# Patient Record
Sex: Male | Born: 1975 | Race: Black or African American | Hispanic: No | Marital: Married | Smoking: Light tobacco smoker
Health system: Southern US, Community
[De-identification: ages and names within clinical notes are randomized; demographics above are authoritative.]

## PROBLEM LIST (undated history)

## (undated) DIAGNOSIS — T7840XA Allergy, unspecified, initial encounter: Secondary | ICD-10-CM

## (undated) HISTORY — PX: WISDOM TOOTH EXTRACTION: SHX21

## (undated) HISTORY — PX: COLONOSCOPY: SHX174

## (undated) HISTORY — DX: Allergy, unspecified, initial encounter: T78.40XA

---

## 2000-05-24 ENCOUNTER — Emergency Department (HOSPITAL_COMMUNITY): Admission: EM | Admit: 2000-05-24 | Discharge: 2000-05-24 | Payer: Self-pay | Admitting: Emergency Medicine

## 2010-05-21 ENCOUNTER — Encounter (INDEPENDENT_AMBULATORY_CARE_PROVIDER_SITE_OTHER): Payer: Self-pay | Admitting: *Deleted

## 2010-06-16 ENCOUNTER — Ambulatory Visit
Admission: RE | Admit: 2010-06-16 | Discharge: 2010-06-16 | Payer: Self-pay | Source: Home / Self Care | Attending: Gastroenterology | Admitting: Gastroenterology

## 2010-06-25 ENCOUNTER — Encounter (INDEPENDENT_AMBULATORY_CARE_PROVIDER_SITE_OTHER): Payer: Self-pay | Admitting: *Deleted

## 2010-06-26 NOTE — Miscellaneous (Signed)
Summary: [facility] PV- not due for colon  Clinical Lists Changes  Pt thought his mother had colon CA but she did not.  Father had liver CA but no colon CA.  Pt is 35 and is not having any GI issues.  Talked w/ patient and told him that he would not need screening colonoscopy until he was 49.  Colonoscopy for 2/6 cancelled

## 2010-06-26 NOTE — Letter (Signed)
Summary: Pre Visit Letter Revised  [facility]  [address] [place], [place] [address]   Phone: [phone]  Fax: [phone]        05/21/2010 MRN: [id number] [facility] [address] [place], [place]  [address]             Procedure Date:  06-30-10   Welcome to the Gastroenterology Division at [facility].    You are scheduled to see a nurse for your pre-procedure visit on 06-16-10 at 3:30P.M. on the 3rd floor at [facility], [address].  We ask that you try to arrive at our office 15 minutes prior to your appointment time to allow for check-in.  Please take a minute to review the attached form.  If you answer "Yes" to one or more of the questions on the first Dome[place], we ask that you call the person listed at your earliest opportunity.  If you answer "No" to all of the questions, please complete the rest of the form and bring it to your appointment.    Your nurse visit will consist of discussing your medical and surgical history, your immediate family medical history, and your medications.   If you are unable to list all of your medications on the form, please bring the medication bottles to your appointment and we will list them.  We will need to be aware of both prescribed and over the counter drugs.  We will need to know exact dosage information as well.    Please be prepared to read and sign documents such as consent forms, a financial agreement, and acknowledgement forms.  If necessary, and with your consent, a friend or relative is welcome to sit-in on the nurse visit with you.  Please bring your insurance card so that we may make a copy of it.  If your insurance requires a referral to see a specialist, please bring your referral form from your primary care physician.  No co-pay is required for this nurse visit.     If you cannot keep your appointment, please call [phone] to cancel or reschedule prior to your appointment date.  This allows [place]  the opportunity to schedule an appointment for another patient in need of care.    Thank you for choosing [facility] for your medical needs.  We appreciate the opportunity to care for you.  Please visit [place] at our website  to learn more about our practice.  Sincerely, The Gastroenterology Division

## 2010-06-30 ENCOUNTER — Other Ambulatory Visit: Payer: Self-pay | Admitting: Gastroenterology

## 2010-07-02 NOTE — Letter (Signed)
Summary: Pre Visit Letter Revised  [facility]  [address] [place], [place] [address]   Phone: [phone]  Fax: [phone]        06/25/2010 MRN: [id number] [facility] [place] [address] [place], [place]  [address]             Procedure Date: July 18, 2010   dir col-Dr Annamarie Major to the Gastroenterology Division at [facility].    You are scheduled to see a nurse for your pre-procedure visit on July 04, 2010 at 2:30pm on the 3rd floor at [facility], [address].  We ask that you try to arrive at our office 15 minutes prior to your appointment time to allow for check-in.  Please take a minute to review the attached form.  If you answer "Yes" to one or more of the questions on the first [place], we ask that you call the person listed at your earliest opportunity.  If you answer "No" to all of the questions, please complete the rest of the form and bring it to your appointment.    Your nurse visit will consist of discussing your medical and surgical history, your immediate family medical history, and your medications.   If you are unable to list all of your medications on the form, please bring the medication bottles to your appointment and we will list them.  We will need to be aware of both prescribed and over the counter drugs.  We will need to know exact dosage information as well.    Please be prepared to read and sign documents such as consent forms, a financial agreement, and acknowledgement forms.  If necessary, and with your consent, a friend or relative is welcome to sit-in on the nurse visit with you.  Please bring your insurance card so that we may make a copy of it.  If your insurance requires a referral to see a specialist, please bring your referral form from your primary care physician.  No co-pay is required for this nurse visit.     If you cannot keep your appointment, please call [phone] to cancel or reschedule prior to  your appointment date.  This allows [place] the opportunity to schedule an appointment for another patient in need of care.    Thank you for choosing [facility] for your medical needs.  We appreciate the opportunity to care for you.  Please visit [place] at our website  to learn more about our practice.  Sincerely, The Gastroenterology Division

## 2010-07-03 ENCOUNTER — Encounter (INDEPENDENT_AMBULATORY_CARE_PROVIDER_SITE_OTHER): Payer: Self-pay | Admitting: *Deleted

## 2010-07-04 ENCOUNTER — Telehealth: Payer: Self-pay | Admitting: Gastroenterology

## 2010-07-04 ENCOUNTER — Encounter: Payer: Self-pay | Admitting: Gastroenterology

## 2010-07-10 NOTE — Progress Notes (Signed)
Summary: ? time for colonoscopy  Phone Note Call from Patient   Summary of Call: Patient is self referred (no primary MD) in today for previsit for colonoscopy due to family history. He came in on 06-16-10 for PV but his procedure was cancelled due to the fact he told nurse that he thought his mother had colon ca but when he called his sister during the PV she said no and that his father had liver cancer. Patient back today states his father did have colon cancer in his 55's. Patient denies any GI concerns or problems. Patient is 35 yo african [place] male. Should patient have colonoscopy at this time? please advise, thanks Initial call taken by: Sherren Kerns RN,  July 04, 2010 2:51 PM  Follow-up for Phone Call        Typically we recommend to start CRC screening about 10 years before the family member had colon cancer, and no later than age 3, which ever is sooner. Some recommend screening african americans earlier than others. Although this is a little before than age 35 it is reasonable for him to have a higher risk screening colonoscopy now. Follow-up by: Meryl Dare MD Clementeen Graham,  July 04, 2010 3:21 PM  Additional Follow-up for Phone Call Additional follow up Details #1::        Patient will have colonoscopy as scheduled.  Additional Follow-up by: Sherren Kerns RN,  July 04, 2010 5:14 PM

## 2010-07-10 NOTE — Miscellaneous (Signed)
Summary: --previsit done  Clinical Lists Changes  Medications: Added new medication of MOVIPREP 100 GM  SOLR (PEG-KCL-NACL-NASULF-NA ASC-C) As per prep instructions. - Signed Rx of MOVIPREP 100 GM  SOLR (PEG-KCL-NACL-NASULF-NA ASC-C) As per prep instructions.;  #1 x 0;  Signed;  Entered by: Sherren Kerns RN;  Authorized by: Meryl Dare MD [facility];  Method used: Electronically to [facility]  [phone]*, [address] [place], [place], [place]  [address], Ph: [phone] or [phone], Fax: [phone] Allergies: Added new allergy or adverse reaction of * DIMEATAPP Observations: Added new observation of ALLERGY REV: Done (07/04/2010 14:19) Added new observation of NKA: F (07/04/2010 14:19)    Prescriptions: MOVIPREP 100 GM  SOLR (PEG-KCL-NACL-NASULF-NA ASC-C) As per prep instructions.  #1 x 0   Entered by:   Sherren Kerns RN   Authorized by:   Meryl Dare MD [facility]   Signed by:   Sherren Kerns RN on 07/04/2010   Method used:   Electronically to        [facility]  [phone]* (retail)       [address]       [place], [place]  [address]       Ph: [phone] or [phone]       Fax: [phone]   RxID:   [id number]

## 2010-07-10 NOTE — Letter (Signed)
Summary: [facility] Instructions  [facility]  [address] [place], [place] [address]   Phone: [phone]  Fax: [phone]       Randall Ramirez    1976-02-07    MRN: [id number]        Procedure Day /Date: Friday 07/18/10     Arrival Time: 7:30 am     Procedure Time: 8:30 am     Location of Procedure:                    _X_  [facility] (4th Floor)  PREPARATION FOR COLONOSCOPY WITH MOVIPREP   Starting 5 days prior to your procedure 06/12/10  do not eat nuts, seeds, popcorn, corn, beans, peas,  salads, or any raw vegetables.  Do not take any fiber supplements (e.g. Metamucil, Citrucel, and Benefiber).  THE DAY BEFORE YOUR PROCEDURE         DATE: 07/17/10    DAY: Thursday    1.  Drink clear liquids the entire day-NO SOLID FOOD  2.  Do not drink anything colored red or purple.  Avoid juices with pulp.  No orange juice.  3.  Drink at least 64 oz. (8 glasses) of fluid/clear liquids during the day to prevent dehydration and help the prep work efficiently.  CLEAR LIQUIDS INCLUDE: Water Jello Ice Popsicles Tea (sugar ok, no milk/cream) Powdered fruit flavored drinks Coffee (sugar ok, no milk/cream) Gatorade Juice: apple, white grape, white cranberry  Lemonade Clear bullion, consomm, broth Carbonated beverages (any kind) Strained chicken noodle soup Hard Candy                             4.  In the morning, mix first dose of MoviPrep solution:    Empty 1 Pouch A and 1 Pouch B into the disposable container    Add lukewarm drinking water to the top line of the container. Mix to dissolve    Refrigerate (mixed solution should be used within 24 hrs)  5.  Begin drinking the prep at 5:00 p.m. The MoviPrep container is divided by 4 marks.   Every 15 minutes drink the solution down to the next mark (approximately 8 oz) until the full liter is complete.   6.  Follow completed prep with 16 oz of clear liquid of your choice (Nothing red or purple).   Continue to drink clear liquids until bedtime.  7.  Before going to bed, mix second dose of MoviPrep solution:    Empty 1 Pouch A and 1 Pouch B into the disposable container    Add lukewarm drinking water to the top line of the container. Mix to dissolve    Refrigerate  THE DAY OF YOUR PROCEDURE      DATE: 07/18/10   DAY: Friday   Beginning at 3:30 a.m. (5 hours before procedure):         1. Every 15 minutes, drink the solution down to the next mark (approx 8 oz) until the full liter is complete.  2. Follow completed prep with 16 oz. of clear liquid of your choice.    3. You may drink clear liquids until 6:30 am  (2 HOURS BEFORE PROCEDURE).   MEDICATION INSTRUCTIONS  Unless otherwise instructed, you should take regular prescription medications with a small sip of water   as early as possible the morning of your procedure.         OTHER INSTRUCTIONS  You  will need a responsible adult at least 36 years of age to accompany you and drive you home.   This person must remain in the waiting room during your procedure.  Wear loose fitting clothing that is easily removed.  Leave jewelry and other valuables at home.  However, you may wish to bring a book to read or  an iPod/MP3 player to listen to music as you wait for your procedure to start.  Remove all body piercing jewelry and leave at home.  Total time from sign-in until discharge is approximately 2-3 hours.  You should go home directly after your procedure and rest.  You can resume normal activities the  day after your procedure.  The day of your procedure you should not:   Drive   Make legal decisions   Operate machinery   Drink alcohol   Return to work  You will receive specific instructions about eating, activities and medications before you leave.    The above instructions have been reviewed and explained to me by   Sherren Kerns RN  July 04, 2010 2:35 PM     I fully understand and can verbalize  these instructions _____________________________ Date _________

## 2010-07-18 ENCOUNTER — Encounter: Payer: Self-pay | Admitting: Gastroenterology

## 2010-07-18 ENCOUNTER — Other Ambulatory Visit (AMBULATORY_SURGERY_CENTER): Payer: Managed Care, Other (non HMO) | Admitting: Gastroenterology

## 2010-07-18 DIAGNOSIS — Z1211 Encounter for screening for malignant neoplasm of colon: Secondary | ICD-10-CM

## 2010-07-18 DIAGNOSIS — Z800 Family history of malignant neoplasm of digestive organs: Secondary | ICD-10-CM

## 2010-07-22 NOTE — Procedures (Addendum)
Summary: Colonoscopy  Patient: Randall Ramirez Note: All result statuses are Final unless otherwise noted.  Tests: (1) Colonoscopy (COL)   COL Colonoscopy           DONE (C)     [facility]     [address]     [place], [place]  [address]          OPERATIVE PROCEDURE REPORT          PATIENT:  Randall Ramirez, Randall Ramirez  MR#:  [id number]     BIRTHDATE:  1976/03/19, 34 yrs. old  GENDER:  male     ENDOSCOPIST:  Judie Petit T. Russella Dar, MD, [facility]          PROCEDURE DATE:  07/18/2010     PROCEDURE:  Colonoscopy [address]     ASA CLASS:  Class I     INDICATIONS:  Elevated Risk Screening, family history of colon     cancer: father in his 62s.     MEDICATIONS:   Fentanyl 50 mcg IV, Versed 6 mg IV     DESCRIPTION OF PROCEDURE:   After the risks benefits and     alternatives of the procedure were thoroughly explained, informed     consent was obtained.  Digital rectal exam was performed and     revealed no abnormalities.   The LB [id number]-[id number] endoscope     was introduced through the anus and advanced to the cecum, which     was identified by both the appendix and ileocecal valve, without     limitations.  The quality of the prep was excellent, using     MoviPrep.  The instrument was then slowly withdrawn as the colon     was fully examined.          <<PROCEDUREIMAGES>>          FINDINGS:  A normal appearing cecum, ileocecal valve, and     appendiceal orifice were identified. The ascending, hepatic     flexure, transverse, splenic flexure, descending, sigmoid colon,     and rectum appeared unremarkable.  Retroflexed views in the rectum     revealed no abnormalities.  The scope was then withdrawn from the     patient and the procedure terminated.          COMPLICATIONS:  None          ENDOSCOPIC IMPRESSION:     1) Normal colon          RECOMMENDATIONS:     1) Given your family history of colon cancer, you should have a     repeat colonoscopy in 5 years          Randall Ramirez T. Russella Dar,  MD, [facility]          n.     REVISED:  07/30/2010 08:45 AM     eSIGNED:   Venita Lick. Ioma Chismar at 07/30/2010 08:45 AM          Wiens, Iantha Fallen, [id number]  Note: An exclamation mark (!) indicates a result that was not dispersed into the flowsheet. Document Creation Date: 07/30/2010 8:45 AM _______________________________________________________________________  (1) Order result status: Final Collection or observation date-time: 07/18/2010 08:54 Requested date-time:  Receipt date-time:  Reported date-time:  Referring Physician:   Ordering Physician: Claudette Head [phone]) Specimen Source:  Source: Launa Grill Order Number: [id number] Lab site:

## 2014-08-08 ENCOUNTER — Encounter: Payer: Self-pay | Admitting: Podiatrist

## 2014-08-08 ENCOUNTER — Ambulatory Visit (INDEPENDENT_AMBULATORY_CARE_PROVIDER_SITE_OTHER): Payer: Managed Care, Other (non HMO)

## 2014-08-08 ENCOUNTER — Ambulatory Visit (INDEPENDENT_AMBULATORY_CARE_PROVIDER_SITE_OTHER): Payer: Managed Care, Other (non HMO) | Admitting: Podiatrist

## 2014-08-08 VITALS — BP 157/90 | HR 100 | Resp 12

## 2014-08-08 DIAGNOSIS — M7661 Achilles tendinitis, right leg: Secondary | ICD-10-CM | POA: Diagnosis not present

## 2014-08-08 DIAGNOSIS — R52 Pain, unspecified: Secondary | ICD-10-CM

## 2014-08-08 DIAGNOSIS — M7662 Achilles tendinitis, left leg: Secondary | ICD-10-CM

## 2014-08-08 MED ORDER — METHYLPREDNISOLONE (PAK) 4 MG PO TABS: ORAL_TABLET | ORAL | Status: DC

## 2014-08-08 NOTE — Progress Notes (Signed)
   Subjective:    Patient ID: Bernie CoveyKenneth E Kauffmann, male    DOB: 1976/02/22, 39 y.o.   MRN: [id number]  HPI  PT STATED BACK OF THE HEEL ARE SORE FOR 8 MONTHS. HEEL ARE GETTING WORSE ESPECIALLY FIRST STEP IN THE MORNING. TRIED SOAK WITH WARM WATER.  Review of Systems  Allergic/Immunologic: Positive for environmental allergies.  All other systems reviewed and are negative.      Objective:   Physical Exam  GENERAL APPEARANCE: Alert, conversant. Appropriately groomed. No acute distress.  VASCULAR: Pedal pulses palpable and strong bilateral.  Capillary refill time is immediate to all digits,  Proximal to distal cooling it warm to warm.  Digital hair growth is present bilateral  NEUROLOGIC: sensation is intact epicritically and protectively to 5.07 monofilament at 5/5 sites bilateral.  Light touch is intact bilateral, vibratory sensation intact bilateral, achilles tendon reflex is intact bilateral.  Negative Tinel sign is elicited MUSCULOSKELETAL:  Pain on palpation posterior aspect of bilateral heels at insertion of achilles tendon on posterior calcaneus is noted.  Moderate inflammation medially and laterally at the insertion points is noted.  No pain along the platnar fascia insertion bilateral.  Rectus foot type is seen. Achilles tendon is palpated and is intact.  DERMATOLOGIC: skin color, texture, and turger are within normal limits.  No preulcerative lesions are seen, no interdigital maceration noted.  No open lesions present.  Digital nails are asymptomatic.     Assessment & Plan:  Achilles tendonitis bilateral  Plan:  Discussed treatment options and alternatives.  Recommended topical antiinflammatory from [facility] as well as a medro dose pack and stretching exercises.  Will recheck in 4 weeks and will consider boot or pt based on improvement.

## 2014-08-08 NOTE — Patient Instructions (Signed)
You have been prescribed a topical pain medications through a specialty compounding pharmacy called [facility] out of [place], [place]. A representative from the pharmacy will call to ensure you would like to receive the medication. If you do not hear from them within two business days please call to confirm they have received the prescription. Their telephone number is [phone].    If you have any other questions or concerns regarding the medication please call our office.     Achilles Tendinitis  with Rehab Achilles tendinitis is a disorder of the Achilles tendon. The Achilles tendon connects the large calf muscles (Gastrocnemius and Soleus) to the heel bone (calcaneus). This tendon is sometimes called the heel cord. It is important for pushing-off and standing on your toes and is important for walking, running, or jumping. Tendinitis is often caused by overuse and repetitive microtrauma. SYMPTOMS  Pain, tenderness, swelling, warmth, and redness may occur over the Achilles tendon even at rest.  Pain with pushing off, or flexing or extending the ankle.  Pain that is worsened after or during activity. CAUSES   Overuse sometimes seen with rapid increase in exercise programs or in sports requiring running and jumping.  Poor physical conditioning (strength and flexibility or endurance).  Running sports, especially training running down hills.  Inadequate warm-up before practice or play or failure to stretch before participation.  Injury to the tendon. PREVENTION   Warm up and stretch before practice or competition.  Allow time for adequate rest and recovery between practices and competition.  Keep up conditioning.  Keep up ankle and leg flexibility.  Improve or keep muscle strength and endurance.  Improve cardiovascular fitness.  Use proper technique.  Use proper equipment (shoes, skates).  To help prevent recurrence, taping, protective strapping, or an  adhesive bandage may be recommended for several weeks after healing is complete. PROGNOSIS   Recovery may take weeks to several months to heal.  Longer recovery is expected if symptoms have been prolonged.  Recovery is usually quicker if the inflammation is due to a direct blow as compared with overuse or sudden strain. RELATED COMPLICATIONS   Healing time will be prolonged if the condition is not correctly treated. The injury must be given plenty of time to heal.  Symptoms can reoccur if activity is resumed too soon.  Untreated, tendinitis may increase the risk of tendon rupture requiring additional time for recovery and possibly surgery. TREATMENT   The first treatment consists of rest anti-inflammatory medication, and ice to relieve the pain.  Stretching and strengthening exercises after resolution of pain will likely help reduce the risk of recurrence. Referral to a physical therapist or athletic trainer for further evaluation and treatment may be helpful.  A walking boot or cast may be recommended to rest the Achilles tendon. This can help break the cycle of inflammation and microtrauma.  Arch supports (orthotics) may be prescribed or recommended by your caregiver as an adjunct to therapy and rest.  Surgery to remove the inflamed tendon lining or degenerated tendon tissue is rarely necessary and has shown less than predictable results. MEDICATION   Nonsteroidal anti-inflammatory medications, such as aspirin and ibuprofen, may be used for pain and inflammation relief. Do not take within 7 days before surgery. Take these as directed by your caregiver. Contact your caregiver immediately if any bleeding, stomach upset, or signs of allergic reaction occur. Other minor pain relievers, such as acetaminophen, may also be used.  Pain relievers may be prescribed as necessary by  your caregiver. Do not take prescription pain medication for longer than 4 to 7 days. Use only as directed and only  as much as you need.  Cortisone injections are rarely indicated. Cortisone injections may weaken tendons and predispose to rupture. It is better to give the condition more time to heal than to use them. HEAT AND COLD  Cold is used to relieve pain and reduce inflammation for acute and chronic Achilles tendinitis. Cold should be applied for 10 to 15 minutes every 2 to 3 hours for inflammation and pain and immediately after any activity that aggravates your symptoms. Use ice packs or an ice massage.  Heat may be used before performing stretching and strengthening activities prescribed by your caregiver. Use a heat pack or a warm soak. SEEK MEDICAL CARE IF:  Symptoms get worse or do not improve in 2 weeks despite treatment.  New, unexplained symptoms develop. Drugs used in treatment may produce side effects. EXERCISES RANGE OF MOTION (ROM) AND STRETCHING EXERCISES - Achilles Tendinitis  These exercises may help you when beginning to rehabilitate your injury. Your symptoms may resolve with or without further involvement from your physician, physical therapist or athletic trainer. While completing these exercises, remember:   Restoring tissue flexibility helps normal motion to return to the joints. This allows healthier, less painful movement and activity.  An effective stretch should be held for at least 30 seconds.  A stretch should never be painful. You should only feel a gentle lengthening or release in the stretched tissue. STRETCH - Gastroc, Standing   Place hands on wall.  Extend right / left leg, keeping the front knee somewhat bent.  Slightly point your toes inward on your back foot.  Keeping your right / left heel on the floor and your knee straight, shift your weight toward the wall, not allowing your back to arch.  You should feel a gentle stretch in the right / left calf. Hold this position for __________ seconds. Repeat __________ times. Complete this stretch __________ times  per day. STRETCH - Soleus, Standing   Place hands on wall.  Extend right / left leg, keeping the other knee somewhat bent.  Slightly point your toes inward on your back foot.  Keep your right / left heel on the floor, bend your back knee, and slightly shift your weight over the back leg so that you feel a gentle stretch deep in your back calf.  Hold this position for __________ seconds. Repeat __________ times. Complete this stretch __________ times per day. STRETCH - Gastrocsoleus, Standing  Note: This exercise can place a lot of stress on your foot and ankle. Please complete this exercise only if specifically instructed by your caregiver.   Place the ball of your right / left foot on a step, keeping your other foot firmly on the same step.  Hold on to the wall or a rail for balance.  Slowly lift your other foot, allowing your body weight to press your heel down over the edge of the step.  You should feel a stretch in your right / left calf.  Hold this position for __________ seconds.  Repeat this exercise with a slight bend in your knee. Repeat __________ times. Complete this stretch __________ times per day.  STRENGTHENING EXERCISES - Achilles Tendinitis These exercises may help you when beginning to rehabilitate your injury. They may resolve your symptoms with or without further involvement from your physician, physical therapist or athletic trainer. While completing these exercises, remember:  Muscles can gain both the endurance and the strength needed for everyday activities through controlled exercises.  Complete these exercises as instructed by your physician, physical therapist or athletic trainer. Progress the resistance and repetitions only as guided.  You may experience muscle soreness or fatigue, but the pain or discomfort you are trying to eliminate should never worsen during these exercises. If this pain does worsen, stop and make certain you are following the  directions exactly. If the pain is still present after adjustments, discontinue the exercise until you can discuss the trouble with your clinician. STRENGTH - Plantar-flexors   Sit with your right / left leg extended. Holding onto both ends of a rubber exercise band/tubing, loop it around the ball of your foot. Keep a slight tension in the band.  Slowly push your toes away from you, pointing them downward.  Hold this position for __________ seconds. Return slowly, controlling the tension in the band/tubing. Repeat __________ times. Complete this exercise __________ times per day.  STRENGTH - Plantar-flexors   Stand with your feet shoulder width apart. Steady yourself with a wall or table using as little support as needed.  Keeping your weight evenly spread over the width of your feet, rise up on your toes.*  Hold this position for __________ seconds. Repeat __________ times. Complete this exercise __________ times per day.  *If this is too easy, shift your weight toward your right / left leg until you feel challenged. Ultimately, you may be asked to do this exercise with your right / left foot only. STRENGTH - Plantar-flexors, Eccentric  Note: This exercise can place a lot of stress on your foot and ankle. Please complete this exercise only if specifically instructed by your caregiver.   Place the balls of your feet on a step. With your hands, use only enough support from a wall or rail to keep your balance.  Keep your knees straight and rise up on your toes.  Slowly shift your weight entirely to your right / left toes and pick up your opposite foot. Gently and with controlled movement, lower your weight through your right / left foot so that your heel drops below the level of the step. You will feel a slight stretch in the back of your calf at the end position.  Use the healthy leg to help rise up onto the balls of both feet, then lower weight only on the right / left leg again. Build up  to 15 repetitions. Then progress to 3 consecutive sets of 15 repetitions.*  After completing the above exercise, complete the same exercise with a slight knee bend (about 30 degrees). Again, build up to 15 repetitions. Then progress to 3 consecutive sets of 15 repetitions.* Perform this exercise __________ times per day.  *When you easily complete 3 sets of 15, your physician, physical therapist or athletic trainer may advise you to add resistance by wearing a backpack filled with additional weight. STRENGTH - Plantar Flexors, Seated   Sit on a chair that allows your feet to rest flat on the ground. If necessary, sit at the edge of the chair.  Keeping your toes firmly on the ground, lift your right / left heel as far as you can without increasing any discomfort in your ankle. Repeat __________ times. Complete this exercise __________ times a day. *If instructed by your physician, physical therapist or athletic trainer, you may add ____________________ of resistance by placing a weighted object on your right / left knee. Document Released: 12/10/2004  Document Revised: 08/03/2011 Document Reviewed: 08/23/2008 [facility] Patient Information 2015 [place], [place]. This information is not intended to replace advice given to you by your health care provider. Make sure you discuss any questions you have with your health care provider.

## 2015-01-07 ENCOUNTER — Ambulatory Visit (INDEPENDENT_AMBULATORY_CARE_PROVIDER_SITE_OTHER): Payer: Managed Care, Other (non HMO) | Admitting: Podiatry

## 2015-01-07 ENCOUNTER — Ambulatory Visit (INDEPENDENT_AMBULATORY_CARE_PROVIDER_SITE_OTHER): Payer: Managed Care, Other (non HMO)

## 2015-01-07 ENCOUNTER — Encounter: Payer: Self-pay | Admitting: Podiatry

## 2015-01-07 VITALS — Resp 16

## 2015-01-07 DIAGNOSIS — M7662 Achilles tendinitis, left leg: Secondary | ICD-10-CM

## 2015-01-07 DIAGNOSIS — M79671 Pain in right foot: Secondary | ICD-10-CM

## 2015-01-07 DIAGNOSIS — M79672 Pain in left foot: Secondary | ICD-10-CM

## 2015-01-07 DIAGNOSIS — M7661 Achilles tendinitis, right leg: Secondary | ICD-10-CM

## 2015-01-07 MED ORDER — PREDNISONE 10 MG PO TABS: ORAL_TABLET | ORAL | Status: DC

## 2015-01-07 MED ORDER — TRIAMCINOLONE ACETONIDE 10 MG/ML IJ SUSP
10.0000 mg | Freq: Once | INTRAMUSCULAR | Status: AC
  Administered 2015-01-07: 10 mg

## 2015-01-07 NOTE — Patient Instructions (Signed)

## 2015-01-09 NOTE — Progress Notes (Signed)
Subjective:     Patient ID: Randall Ramirez, male   DOB: [birth year], 39 y.o.   MRN: [id number]  HPI patient presents stating I have had problems with the back of my heel for several years left over right and it seems to be more on the outside of the heel that really bothers me   Review of Systems  All other systems reviewed and are negative.      Objective:   Physical Exam  Constitutional: He is oriented to person, place, and time.  Cardiovascular: Intact distal pulses.   Musculoskeletal: Normal range of motion.  Neurological: He is oriented to person, place, and time.  Skin: Skin is warm.  Nursing note and vitals reviewed.   Neurovascular status intact muscle strength adequate with range of motion within normal limits. Patient's noted to have posterior pain in the heel left over right with inflammation noted but no prominence of the actual heel bone itself. 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 At this time I want to try conservative care performed contents her surgery and I discussed first injection with immobilization explaining risk of injection of rupture followed by possible shockwave and if no better we'll have to consider surgery. I did inject the lateral Achilles tendon left 3 mg dexamethasone Kenalog 5 mg Xylocaine applied air fracture walker with instructions on usage and instructed on ice therapy. Patient will be seen back in 3 weeks

## 2015-02-04 ENCOUNTER — Encounter: Payer: Self-pay | Admitting: Podiatry

## 2015-02-04 ENCOUNTER — Ambulatory Visit (INDEPENDENT_AMBULATORY_CARE_PROVIDER_SITE_OTHER): Payer: Managed Care, Other (non HMO) | Admitting: Podiatry

## 2015-02-04 VITALS — BP 155/101 | HR 94 | Resp 16

## 2015-02-04 DIAGNOSIS — M7662 Achilles tendinitis, left leg: Secondary | ICD-10-CM

## 2015-02-04 DIAGNOSIS — M7661 Achilles tendinitis, right leg: Secondary | ICD-10-CM | POA: Diagnosis not present

## 2015-02-06 NOTE — Progress Notes (Signed)
Subjective:     Patient ID: Randall Ramirez, male   DOB: [birth year], 39 y.o.   MRN: [id number]  HPI patient states the back of my left heel is feeling much better with minimal discomfort when I palpate and I'm able to walk but I have not really gone without the boot   Review of Systems     Objective:   Physical Exam Neurovascular status intact muscle strength adequate range of motion within normal limits with patient noted to have significant diminishment of discomfort posterior aspect left heel at the insertional point of the tendon into the calcaneus. Right is also improved    Assessment:     Improved Achilles tendinitis left left over right but has had this problem for a long time    Plan:     Reviewed shoe gear modifications ice therapy stretching exercises and heel lifts which were dispensed today. This may recur and if it does work and the need to address it but he will hopefully not require more advanced treatments. He will gradually reduce the boot over the next 3 weeks

## 2015-02-08 ENCOUNTER — Ambulatory Visit: Payer: Managed Care, Other (non HMO) | Admitting: Internal Medicine

## 2015-06-26 ENCOUNTER — Encounter: Payer: Self-pay | Admitting: Gastroenterology

## 2016-04-13 ENCOUNTER — Emergency Department (HOSPITAL_COMMUNITY): Payer: Managed Care, Other (non HMO)

## 2016-04-13 ENCOUNTER — Emergency Department (HOSPITAL_COMMUNITY)
Admission: EM | Admit: 2016-04-13 | Discharge: 2016-04-13 | Disposition: A | Discharge status: Home / Self Care | Payer: Managed Care, Other (non HMO) | Attending: Emergency Medicine | Admitting: Emergency Medicine

## 2016-04-13 ENCOUNTER — Encounter (HOSPITAL_COMMUNITY): Payer: Self-pay | Admitting: *Deleted

## 2016-04-13 DIAGNOSIS — F17290 Nicotine dependence, other tobacco product, uncomplicated: Secondary | ICD-10-CM | POA: Diagnosis not present

## 2016-04-13 DIAGNOSIS — S83421A Sprain of lateral collateral ligament of right knee, initial encounter: Secondary | ICD-10-CM | POA: Diagnosis not present

## 2016-04-13 DIAGNOSIS — Y939 Activity, unspecified: Secondary | ICD-10-CM | POA: Diagnosis not present

## 2016-04-13 DIAGNOSIS — S83401A Sprain of unspecified collateral ligament of right knee, initial encounter: Secondary | ICD-10-CM

## 2016-04-13 DIAGNOSIS — W11XXXA Fall on and from ladder, initial encounter: Secondary | ICD-10-CM | POA: Insufficient documentation

## 2016-04-13 DIAGNOSIS — Y999 Unspecified external cause status: Secondary | ICD-10-CM | POA: Insufficient documentation

## 2016-04-13 DIAGNOSIS — Z79899 Other long term (current) drug therapy: Secondary | ICD-10-CM | POA: Insufficient documentation

## 2016-04-13 DIAGNOSIS — S8991XA Unspecified injury of right lower leg, initial encounter: Secondary | ICD-10-CM | POA: Diagnosis present

## 2016-04-13 DIAGNOSIS — Y929 Unspecified place or not applicable: Secondary | ICD-10-CM | POA: Insufficient documentation

## 2016-04-13 NOTE — Discharge Instructions (Signed)
X-rays today were negative. Continue taking Motrin as directed

## 2016-04-13 NOTE — ED Triage Notes (Signed)
Pt had a fall last night from a two foot ladder and his right leg got caught up in the ladder.  Pt denies LOC or hitting his head.  Pt reports pain was worse yesterday. Pt took ibuprofen and states pain is better today but states he walks with a minor limp. No obvious deformity noted to right knee and pt has full range of motion on right leg.

## 2016-04-13 NOTE — ED Provider Notes (Signed)
[facility]-EMERGENCY DEPT Provider Note   CSN: [id number] Arrival date & time: 04/13/16  [id number]     History   Chief Complaint Chief Complaint  Patient presents with  . Fall  . Knee Pain    HPI Randall Ramirez is a 40 y.o. male.  40 year old male who fell 2 feet from a ladder yesterday onto his right knee. Denies any head or neck injury. Had sharp pain localized to the lateral collateral ligament that was worse with standing. Denies any right hip or ankle pain. Took ibuprofen which greatly improved his symptoms. He is not able to ambulate. Denies any prior history of knee trauma. Symptoms were better with rest      Past Medical History:  Diagnosis Date  . Allergy     There are no active problems to display for this patient.   Past Surgical History:  Procedure Laterality Date  . WISDOM TOOTH EXTRACTION         Home Medications    Prior to Admission medications   Medication Sig Start Date End Date Taking? Authorizing Provider  fexofenadine (ALLEGRA) 60 MG tablet Take 60 mg by mouth 2 (two) times daily.    Historical Provider, MD    Family History No family history on file.  Social History Social History  Substance Use Topics  . Smoking status: Light Tobacco Smoker    Types: Cigars  . Smokeless tobacco: Never Used  . Alcohol use No     Allergies   Other   Review of Systems Review of Systems  All other systems reviewed and are negative.    Physical Exam Updated Vital Signs BP (!) 167/106 (BP Location: Left Arm)   Pulse 105   Temp 99 F (37.2 C) (Oral)   Resp 17   SpO2 97%   Physical Exam  Constitutional: He is oriented to person, place, and time. He appears well-developed and well-nourished.  Non-toxic appearance.  HENT:  Head: Normocephalic and atraumatic.  Eyes: Conjunctivae are normal. Pupils are equal, round, and reactive to light.  Neck: Normal range of motion.  Cardiovascular: Normal rate.   Pulmonary/Chest: Effort normal.    Musculoskeletal:       Right knee: He exhibits no swelling, no effusion and no deformity. Tenderness found. LCL tenderness noted.  Neurological: He is alert and oriented to person, place, and time.  Skin: Skin is warm and dry.  Psychiatric: He has a normal mood and affect.  Nursing note and vitals reviewed.    ED Treatments / Results  Labs (all labs ordered are listed, but only abnormal results are displayed) Labs Reviewed - No data to display  EKG  EKG Interpretation None       Radiology Dg Knee Complete 4 Views Right  Result Date: 04/13/2016 CLINICAL DATA:  40 year old male fell from ladder with knee pain. Initial encounter. EXAM: RIGHT KNEE - COMPLETE 4+ VIEW COMPARISON:  None. FINDINGS: No evidence of fracture, dislocation, or joint effusion. No evidence of arthropathy or other focal bone abnormality. Soft tissues are unremarkable. IMPRESSION: Negative. Electronically Signed   By: Lacy DuverneySteven  Olson M.D.   On: 04/13/2016 10:22    Procedures Procedures (including critical care time)  Medications Ordered in ED Medications - No data to display   Initial Impression / Assessment and Plan / ED Course  I have reviewed the triage vital signs and the nursing notes.  Pertinent labs & imaging results that were available during my care of the patient were reviewed by me and considered  in my medical decision making (see chart for details).  Clinical Course     X-rays are negative. Likely patient has a right lateral collateral ligament strain. Ortho referral given.  Final Clinical Impressions(s) / ED Diagnoses   Final diagnoses:  None    New Prescriptions New Prescriptions   No medications on file     Lorre NickAnthony Courtenay Creger, MD 04/13/16 1030

## 2016-09-10 ENCOUNTER — Encounter: Payer: Self-pay | Admitting: Gastroenterology

## 2016-11-04 ENCOUNTER — Ambulatory Visit (AMBULATORY_SURGERY_CENTER): Payer: Self-pay | Admitting: *Deleted

## 2016-11-04 VITALS — Ht 72.0 in | Wt 263.0 lb

## 2016-11-04 DIAGNOSIS — Z800 Family history of malignant neoplasm of digestive organs: Secondary | ICD-10-CM

## 2016-11-04 MED ORDER — NA SULFATE-K SULFATE-MG SULF 17.5-3.13-1.6 GM/177ML PO SOLN: ORAL | 0 refills | Status: DC

## 2016-11-04 NOTE — Progress Notes (Signed)
Patient denies any allergies to eggs or soy. Patient denies any problems with anesthesia/sedation. Patient denies any oxygen use at home and does not take any diet/weight loss medications. EMMI education assisgned to patient on colonoscopy, this was explained and instructions given to patient. 

## 2016-11-05 ENCOUNTER — Encounter: Payer: Self-pay | Admitting: Gastroenterology

## 2016-11-17 ENCOUNTER — Encounter: Payer: Self-pay | Admitting: Gastroenterology

## 2016-11-17 ENCOUNTER — Ambulatory Visit (AMBULATORY_SURGERY_CENTER): Payer: Managed Care, Other (non HMO) | Admitting: Gastroenterology

## 2016-11-17 VITALS — BP 129/78 | HR 78 | Temp 97.7°F | Resp 14 | Ht 72.0 in | Wt 263.0 lb

## 2016-11-17 DIAGNOSIS — Z800 Family history of malignant neoplasm of digestive organs: Secondary | ICD-10-CM | POA: Diagnosis not present

## 2016-11-17 DIAGNOSIS — Z1212 Encounter for screening for malignant neoplasm of rectum: Secondary | ICD-10-CM | POA: Diagnosis not present

## 2016-11-17 DIAGNOSIS — Z1211 Encounter for screening for malignant neoplasm of colon: Secondary | ICD-10-CM | POA: Diagnosis not present

## 2016-11-17 MED ORDER — SODIUM CHLORIDE 0.9 % IV SOLN: 500.0000 mL | INTRAVENOUS | Status: DC

## 2016-11-17 NOTE — Progress Notes (Signed)
Report to PACU, RN, vss, BBS= Clear.  

## 2016-11-17 NOTE — Patient Instructions (Signed)
Impression/Recommendations:  Repeat colonoscopy in 5 years for screening purposes.  Resume previous diet. Continue present medications.  YOU HAD AN ENDOSCOPIC PROCEDURE TODAY AT THE [facility]:   Refer to the procedure report that was given to you for any specific questions about what was found during the examination.  If the procedure report does not answer your questions, please call your gastroenterologist to clarify.  If you requested that your care partner not be given the details of your procedure findings, then the procedure report has been included in a sealed envelope for you to review at your convenience later.  YOU SHOULD EXPECT: Some feelings of bloating in the abdomen. Passage of more gas than usual.  Walking can help get rid of the air that was put into your GI tract during the procedure and reduce the bloating. If you had a lower endoscopy (such as a colonoscopy or flexible sigmoidoscopy) you may notice spotting of blood in your stool or on the toilet paper. If you underwent a bowel prep for your procedure, you may not have a normal bowel movement for a few days.  Please Note:  You might notice some irritation and congestion in your nose or some drainage.  This is from the oxygen used during your procedure.  There is no need for concern and it should clear up in a day or so.  SYMPTOMS TO REPORT IMMEDIATELY:   Following lower endoscopy (colonoscopy or flexible sigmoidoscopy):  Excessive amounts of blood in the stool  Significant tenderness or worsening of abdominal pains  Swelling of the abdomen that is new, acute  Fever of 100F or higher  For urgent or emergent issues, a gastroenterologist can be reached at any hour by calling [phone].   DIET:  We do recommend a small meal at first, but then you may proceed to your regular diet.  Drink plenty of fluids but you should avoid alcoholic beverages for 24 hours.  ACTIVITY:  You should plan to take it easy for  the rest of today and you should NOT DRIVE or use heavy machinery until tomorrow (because of the sedation medicines used during the test).    FOLLOW UP: Our staff will call the number listed on your records the next business day following your procedure to check on you and address any questions or concerns that you may have regarding the information given to you following your procedure. If we do not reach you, we will leave a message.  However, if you are feeling well and you are not experiencing any problems, there is no need to return our call.  We will assume that you have returned to your regular daily activities without incident.  If any biopsies were taken you will be contacted by phone or by letter within the next 1-3 weeks.  Please call us at [phone] if you have not heard about the biopsies in 3 weeks.    SIGNATURES/CONFIDENTIALITY: You and/or your care partner have signed paperwork which will be entered into your electronic medical record.  These signatures attest to the fact that that the information above on your After Visit Summary has been reviewed and is understood.  Full responsibility of the confidentiality of this discharge information lies with you and/or your care-partner.

## 2016-11-17 NOTE — Op Note (Signed)
[facility] Patient Name: Randall DunkerKenneth Schlechter Procedure Date: 11/17/2016 10:35 AM MRN: [id number] Endoscopist: Meryl DareMalcolm T Emersyn Kotarski , MD Age: [age] Referring MD:  Date of Birth: 13-Nov-1975 Gender: Male Account #: [id number] Procedure:                Colonoscopy Indications:              Screening in patient at increased risk: Family                            history of 1st-degree relative with colorectal                            cancer before age 42 years Medicines:                Monitored Anesthesia Care Procedure:                Pre-Anesthesia Assessment:                           - Prior to the procedure, a History and Physical                            was performed, and patient medications and                            allergies were reviewed. The patient's tolerance of                            previous anesthesia was also reviewed. The risks                            and benefits of the procedure and the sedation                            options and risks were discussed with the patient.                            All questions were answered, and informed consent                            was obtained. Prior Anticoagulants: The patient has                            taken no previous anticoagulant or antiplatelet                            agents. ASA Grade Assessment: II - A patient with                            mild systemic disease. After reviewing the risks                            and benefits, the patient was deemed in  satisfactory condition to undergo the procedure.                           After obtaining informed consent, the colonoscope                            was passed under direct vision. Throughout the                            procedure, the patient's blood pressure, pulse, and                            oxygen saturations were monitored continuously. The                            Model [id number]-[id number]) scope was  introduced                            through the anus and advanced to the the cecum,                            identified by appendiceal orifice and ileocecal                            valve. The ileocecal valve, appendiceal orifice,                            and rectum were photographed. The quality of the                            bowel preparation was excellent. The colonoscopy                            was performed without difficulty. The patient                            tolerated the procedure well. Scope In: 10:42:51 AM Scope Out: 10:53:16 AM Scope Withdrawal Time: 0 hours 9 minutes 4 seconds  Total Procedure Duration: 0 hours 10 minutes 25 seconds  Findings:                 The perianal and digital rectal examinations were                            normal.                           The entire examined colon appeared normal on direct                            and retroflexion views. Complications:            No immediate complications. Estimated blood loss:                            None. Estimated Blood Loss:  Estimated blood loss: none. Impression:               - The entire examined colon is normal on direct and                            retroflexion views.                           - No specimens collected. Recommendation:           - Repeat colonoscopy in 5 years for screening                            purposes.                           - Patient has a contact number available for                            emergencies. The signs and symptoms of potential                            delayed complications were discussed with the                            patient. Return to normal activities tomorrow.                            Written discharge instructions were provided to the                            patient.                           - Resume previous diet.                           - Continue present medications. Meryl Dare, MD 11/17/2016  10:55:24 AM This report has been signed electronically.

## 2016-11-17 NOTE — Progress Notes (Signed)
Pt's states no medical or surgical changes since previsit or office visit. 

## 2016-11-18 ENCOUNTER — Telehealth: Payer: Self-pay

## 2016-11-18 NOTE — Telephone Encounter (Signed)
  Follow up Call-  Call back number 11/17/2016  Post procedure Call Back phone  # [phone]  Permission to leave phone message Yes  Some recent data might be hidden     Patient questions:  Do you have a fever, pain , or abdominal swelling? No. Pain Score  0 *  Have you tolerated food without any problems? Yes.    Have you been able to return to your normal activities? Yes.    Do you have any questions about your discharge instructions: Diet   No. Medications  No. Follow up visit  No.  Do you have questions or concerns about your Care? No.  Actions: * If pain score is 4 or above: No action needed, pain <4.

## 2016-11-18 NOTE — Telephone Encounter (Signed)
Left message on answering machine. 

## 2018-09-14 IMAGING — CR DG KNEE COMPLETE 4+V*R*
4 series · 4 of 4 positions shown · non-contrast
Comparison: None.

CLINICAL DATA: 40-year-old male fell from ladder with knee pain.
Initial encounter.

EXAM:
RIGHT KNEE - COMPLETE 4+ VIEW

[t knee ap right]
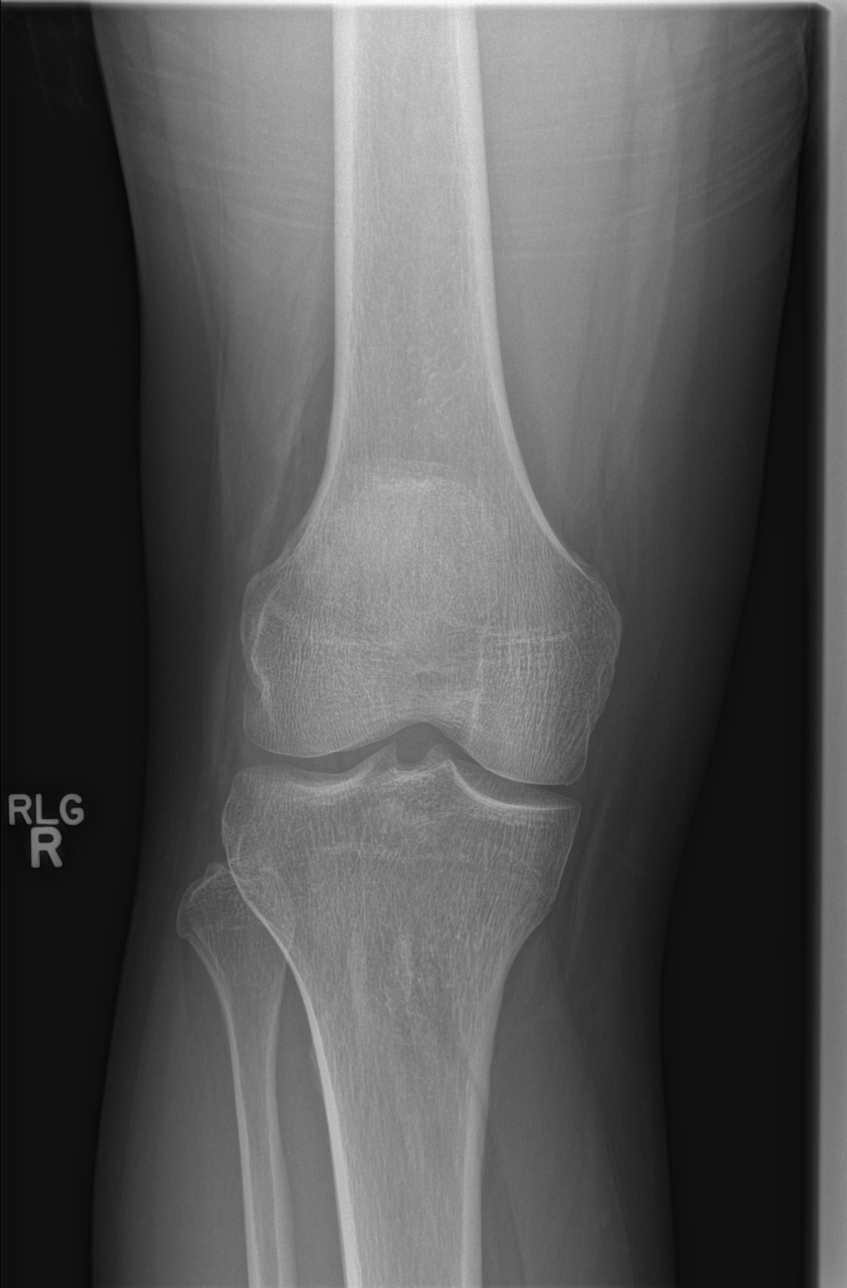

[t knee obl right (1 of 2)]
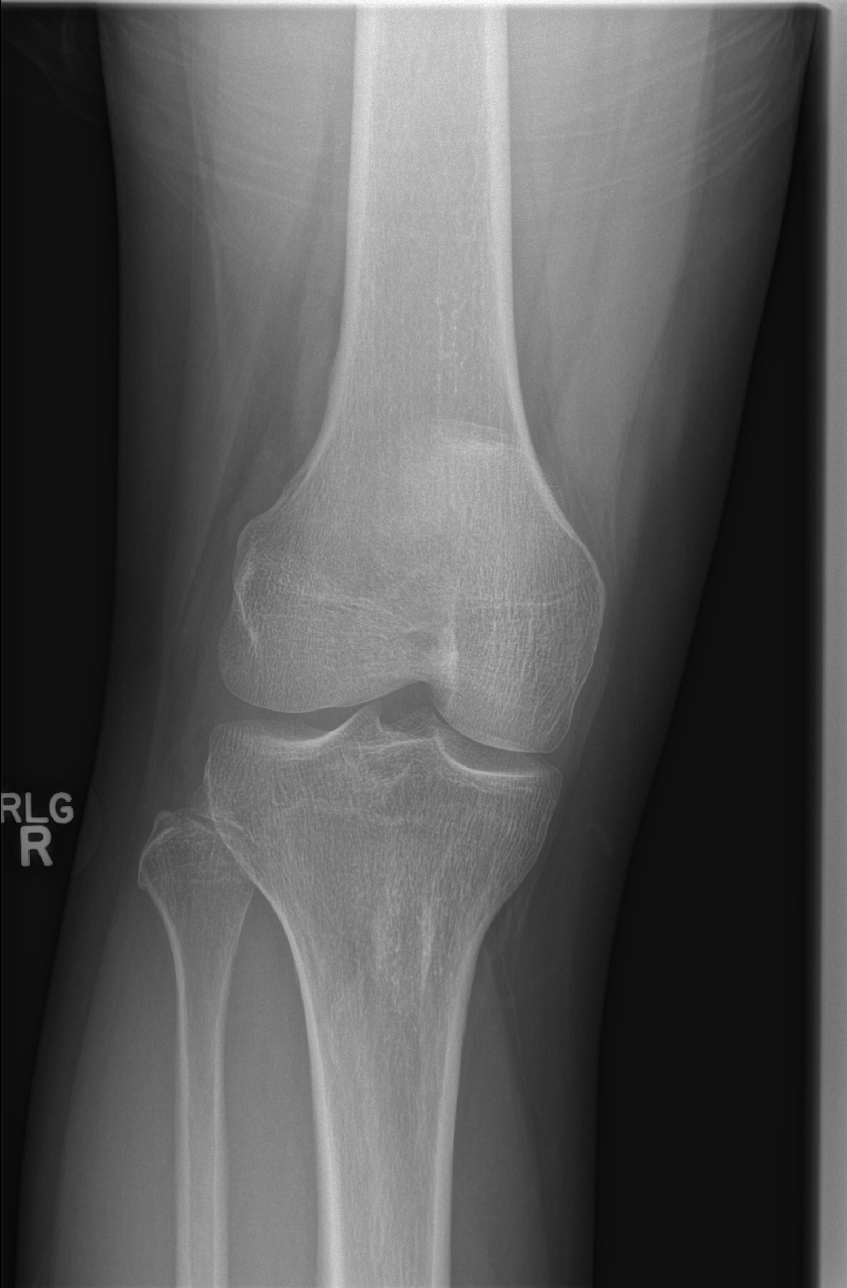

[t knee obl right (2 of 2)]
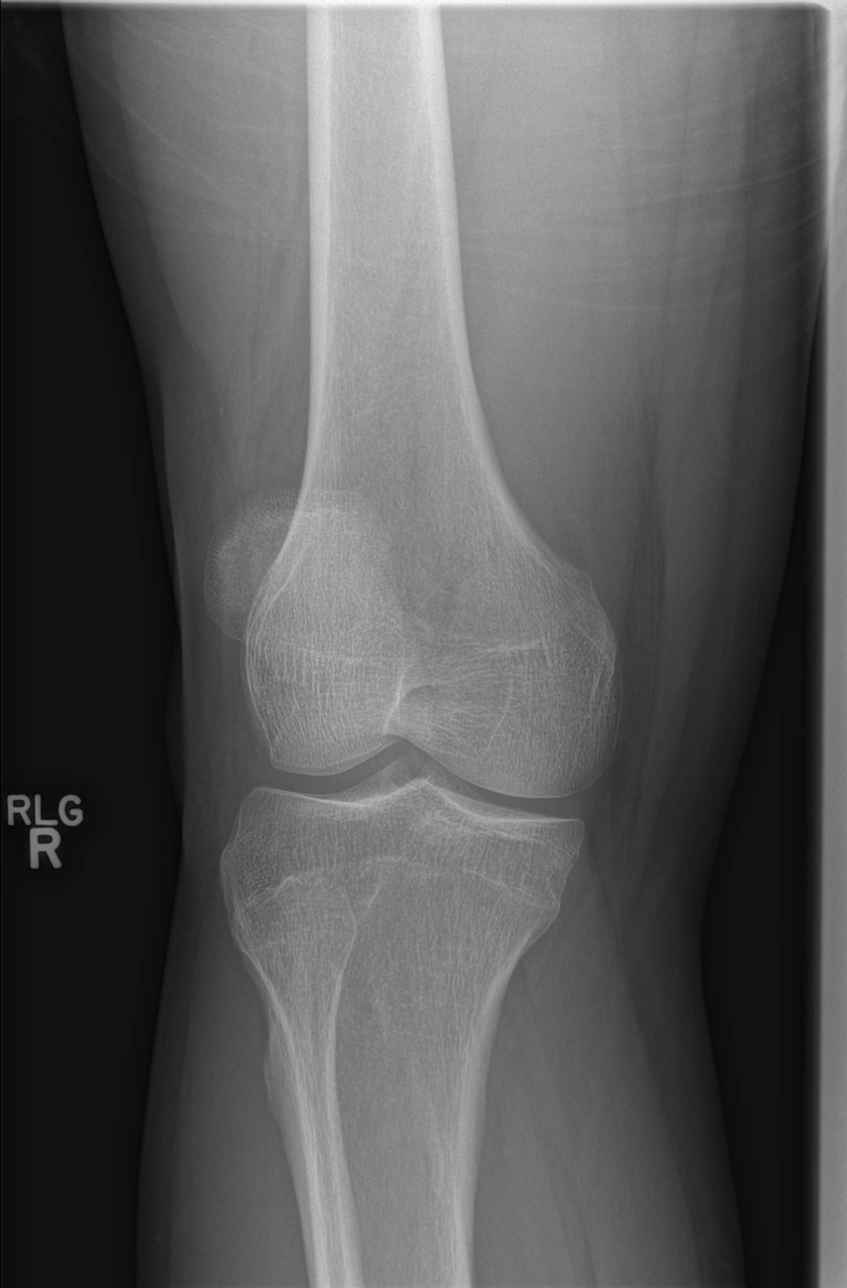

[t knee lat right]
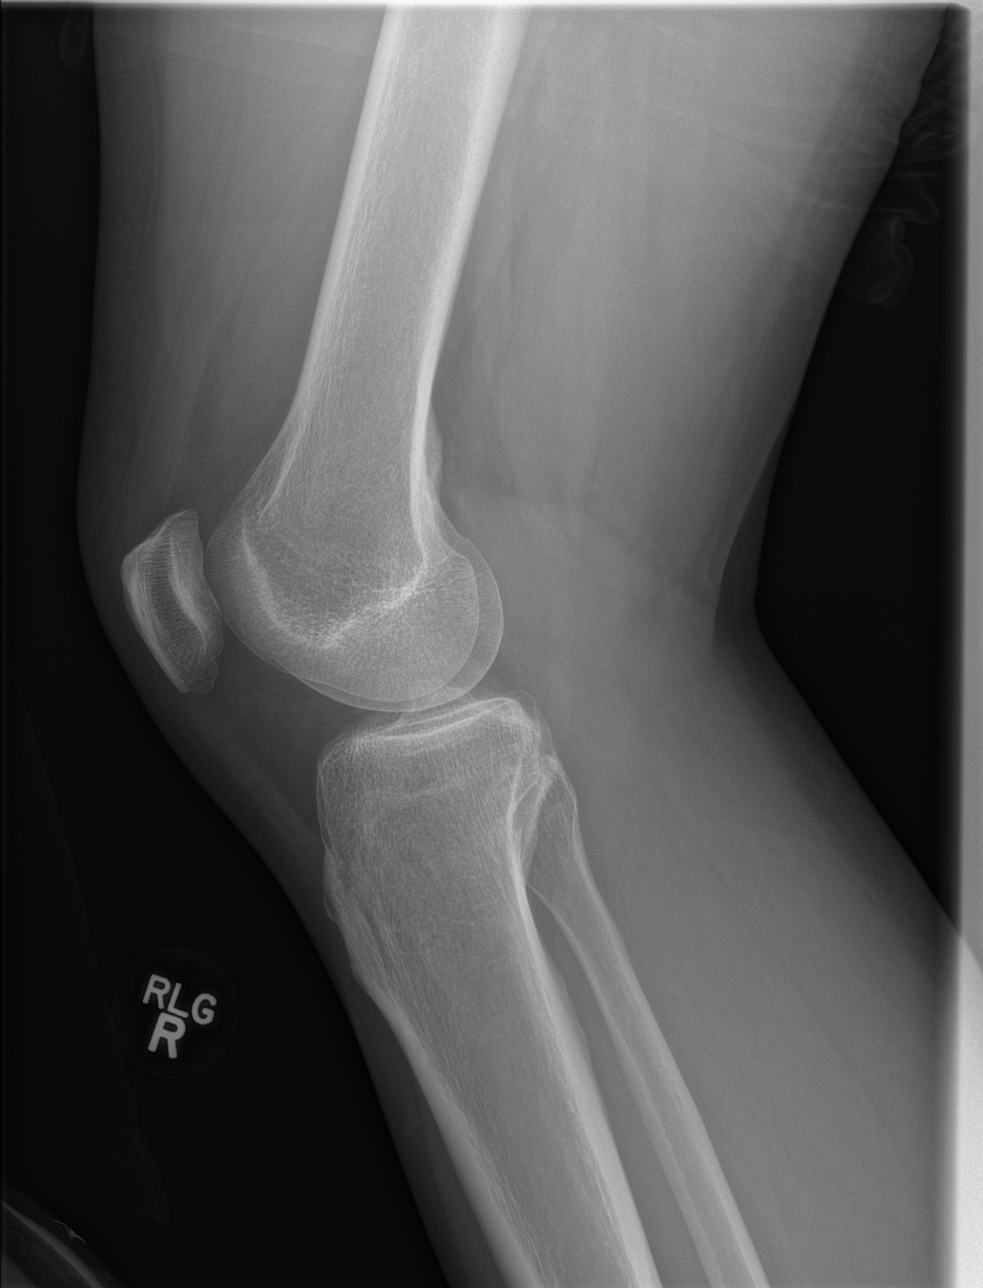

[4 of 4 positions shown; findings below may reference images not displayed]

FINDINGS: No evidence of fracture, dislocation, or joint effusion. No evidence
of arthropathy or other focal bone abnormality. Soft tissues are
unremarkable.
IMPRESSION: Negative.

## 2021-12-03 ENCOUNTER — Encounter: Payer: Self-pay | Admitting: Gastroenterology

## 2023-05-27 ENCOUNTER — Encounter: Payer: Self-pay | Admitting: Internal Medicine

## 2023-07-09 ENCOUNTER — Encounter: Payer: Managed Care, Other (non HMO) | Admitting: Internal Medicine

## 2023-07-27 ENCOUNTER — Ambulatory Visit (AMBULATORY_SURGERY_CENTER): Payer: BC Managed Care – PPO

## 2023-07-27 VITALS — Ht 70.0 in | Wt 230.0 lb

## 2023-07-27 DIAGNOSIS — Z800 Family history of malignant neoplasm of digestive organs: Secondary | ICD-10-CM

## 2023-07-27 MED ORDER — SUFLAVE 178.7 G PO SOLR: 1.0000 | ORAL | 0 refills | Status: DC

## 2023-07-27 NOTE — Progress Notes (Signed)

## 2023-07-27 NOTE — Patient Instructions (Signed)
 [facility] has implemented a new process for scheduling procedures.  Please note your arrival time for the [facility] is your appointment time that is shown on your written instructions.  Please do not arrive one hour prior to the time listed in your instructions.  Please ignore any outside notifications to arrive one hour early.  We apologize for any confusion and look forward to seeing you for your procedure.

## 2023-08-04 ENCOUNTER — Encounter: Payer: Self-pay | Admitting: Internal Medicine

## 2023-08-10 NOTE — Progress Notes (Unsigned)
 [facility] History and Physical   Primary Care Physician:  Patient, No Pcp Per   Reason for Procedure:   Family Hx CRCA  Plan:    Colonoscopy      HPI: Randall Ramirez is a 48 y.o. male here for higher risk screening - his father had colon cancer in his 18's.  Last colonoscopy by Dr. Russella Dar 2018 - normal   Past Medical History:  Diagnosis Date   Allergy     Past Surgical History:  Procedure Laterality Date   COLONOSCOPY     WISDOM TOOTH EXTRACTION      Prior to Admission medications   Medication Sig Start Date End Date Taking? Authorizing Provider  fexofenadine (ALLEGRA) 60 MG tablet Take 60 mg by mouth 2 (two) times daily.    [provider]  loratadine (CLARITIN) 10 MG tablet Take 10 mg by mouth daily.    [provider]  PEG 3350-KCl-NaCl-NaSulf-MgSul (SUFLAVE) 178.7 g SOLR Take 1 kit by mouth as directed. 07/27/23   Iva Boop, MD    Current Outpatient Medications  Medication Sig Dispense Refill   fexofenadine (ALLEGRA) 60 MG tablet Take 60 mg by mouth 2 (two) times daily.     loratadine (CLARITIN) 10 MG tablet Take 10 mg by mouth daily.     PEG 3350-KCl-NaCl-NaSulf-MgSul (SUFLAVE) 178.7 g SOLR Take 1 kit by mouth as directed. 1 each 0   Current Facility-Administered Medications  Medication Dose Route Frequency Provider Last Rate Last Admin   0.9 %  sodium chloride infusion  500 mL Intravenous Continuous Meryl Dare, MD        Allergies as of 08/11/2023 - Review Complete 07/27/2023  Allergen Reaction Noted   Brompheniramine-pseudoeph Other (See Comments) 08/27/2017   Other  01/07/2015    Family History  Problem Relation Age of Onset   Colon cancer Father        in his 101's   Prostate cancer Father    Rectal cancer Neg Hx    Stomach cancer Neg Hx     Social History   Socioeconomic History   Marital status: Married    Spouse name: Not on file   Number of children: Not on file   Years of education: Not on file    Highest education level: Not on file  Occupational History   Not on file  Tobacco Use   Smoking status: Light Smoker    Types: Cigars   Smokeless tobacco: Never  Vaping Use   Vaping status: Never Used  Substance and Sexual Activity   Alcohol use: Yes    Alcohol/week: 0.0 standard drinks of alcohol    Comment: occ.   Drug use: No   Sexual activity: Not on file  Other Topics Concern   Not on file  Social History Narrative   Not on file   Social Drivers of Health   Financial Resource Strain: Not on file  Food Insecurity: Not on file  Transportation Needs: Not on file  Physical Activity: Not on file  Stress: Not on file  Social Connections: Unknown (10/06/2021)   Received from [facility], [facility]   Social Network    Social Network: Not on file  Intimate Partner Violence: Unknown (08/28/2021)   Received from [facility], [facility]   HITS    Physically Hurt: Not on file    Insult or Talk Down To: Not on file    Threaten Physical Harm: Not on file    Scream or  Curse: Not on file    Review of Systems: Positive for *** All other review of systems negative except as mentioned in the HPI.  Physical Exam: Vital signs There were no vitals taken for this visit.  General:   Alert,  Well-developed, well-nourished, pleasant and cooperative in NAD Lungs:  Clear throughout to auscultation.   Heart:  Regular rate and rhythm; no murmurs, clicks, rubs,  or gallops. Abdomen:  Soft, nontender and nondistended. Normal bowel sounds.   Neuro/Psych:  Alert and cooperative. Normal mood and affect. A and O x 3   @Keaten Mashek  Sena Slate, MD, [facility] [phone] (pager) 08/10/2023 5:25 PM@

## 2023-08-11 ENCOUNTER — Encounter: Payer: Self-pay | Admitting: Internal Medicine

## 2023-08-11 ENCOUNTER — Ambulatory Visit (AMBULATORY_SURGERY_CENTER): Payer: Managed Care, Other (non HMO) | Admitting: Internal Medicine

## 2023-08-11 VITALS — BP 166/103 | HR 83 | Temp 97.4°F | Resp 17 | Ht 70.0 in | Wt 230.0 lb

## 2023-08-11 DIAGNOSIS — Z800 Family history of malignant neoplasm of digestive organs: Secondary | ICD-10-CM | POA: Diagnosis not present

## 2023-08-11 DIAGNOSIS — R030 Elevated blood-pressure reading, without diagnosis of hypertension: Secondary | ICD-10-CM

## 2023-08-11 DIAGNOSIS — Z1211 Encounter for screening for malignant neoplasm of colon: Secondary | ICD-10-CM | POA: Diagnosis present

## 2023-08-11 MED ORDER — SODIUM CHLORIDE 0.9 % IV SOLN: 500.0000 mL | Freq: Once | INTRAVENOUS | Status: DC

## 2023-08-11 NOTE — Patient Instructions (Addendum)
 You need to be evaluated for treatment of high blood pressure. You can start with an urgent care but you need to obtain a primary care provider.  Colonoscopy was normal.  I appreciate the opportunity to care for you. Iva Boop, MD, FACG   YOU HAD AN ENDOSCOPIC PROCEDURE TODAY AT THE [facility]:   Refer to the procedure report that was given to you for any specific questions about what was found during the examination.  If the procedure report does not answer your questions, please call your gastroenterologist to clarify.  If you requested that your care partner not be given the details of your procedure findings, then the procedure report has been included in a sealed envelope for you to review at your convenience later.  YOU SHOULD EXPECT: Some feelings of bloating in the abdomen. Passage of more gas than usual.  Walking can help get rid of the air that was put into your GI tract during the procedure and reduce the bloating. If you had a lower endoscopy (such as a colonoscopy or flexible sigmoidoscopy) you may notice spotting of blood in your stool or on the toilet paper. If you underwent a bowel prep for your procedure, you may not have a normal bowel movement for a few days.  Please Note:  You might notice some irritation and congestion in your nose or some drainage.  This is from the oxygen used during your procedure.  There is no need for concern and it should clear up in a day or so.  SYMPTOMS TO REPORT IMMEDIATELY:  Following lower endoscopy (colonoscopy or flexible sigmoidoscopy):  Excessive amounts of blood in the stool  Significant tenderness or worsening of abdominal pains  Swelling of the abdomen that is new, acute  Fever of 100F or higher  For urgent or emergent issues, a gastroenterologist can be reached at any hour by calling [phone]. Do not use MyChart messaging for urgent concerns.    DIET:  We do recommend a small meal at first, but then you may  proceed to your regular diet.  Drink plenty of fluids but you should avoid alcoholic beverages for 24 hours.  ACTIVITY:  You should plan to take it easy for the rest of today and you should NOT DRIVE or use heavy machinery until tomorrow (because of the sedation medicines used during the test).    FOLLOW UP: Our staff will call the number listed on your records the next business day following your procedure.  We will call around 7:15- 8:00 am to check on you and address any questions or concerns that you may have regarding the information given to you following your procedure. If we do not reach you, we will leave a message.     SIGNATURES/CONFIDENTIALITY: You and/or your care partner have signed paperwork which will be entered into your electronic medical record.  These signatures attest to the fact that that the information above on your After Visit Summary has been reviewed and is understood.  Full responsibility of the confidentiality of this discharge information lies with you and/or your care-partner.

## 2023-08-11 NOTE — Progress Notes (Signed)
 Vss nad trans to pacu

## 2023-08-11 NOTE — Progress Notes (Signed)
 Pt's states no medical or surgical changes since previsit or office visit.

## 2023-08-11 NOTE — Op Note (Signed)
 [facility] Patient Name: Randall Ramirez Procedure Date: 08/11/2023 10:34 AM MRN: [id number] Endoscopist: Iva Boop , MD, [id number] Age: 48 Referring MD:  Date of Birth: Jul 12, 1975 Gender: Male Account #: [id number] Procedure:                Colonoscopy Indications:              Screening in patient at increased risk: Colorectal                            cancer in father before age 27 Medicines:                Monitored Anesthesia Care Procedure:                Pre-Anesthesia Assessment:                           - Prior to the procedure, a History and Physical                            was performed, and patient medications and                            allergies were reviewed. The patient's tolerance of                            previous anesthesia was also reviewed. The risks                            and benefits of the procedure and the sedation                            options and risks were discussed with the patient.                            All questions were answered, and informed consent                            was obtained. Prior Anticoagulants: The patient has                            taken no anticoagulant or antiplatelet agents. ASA                            Grade Assessment: III - A patient with severe                            systemic disease. After reviewing the risks and                            benefits, the patient was deemed in satisfactory                            condition to undergo the procedure.  After obtaining informed consent, the colonoscope                            was passed under direct vision. Throughout the                            procedure, the patient's blood pressure, pulse, and                            oxygen saturations were monitored continuously. The                            CF HQ190L #[id number] was introduced through the anus                            and advanced to the the  cecum, identified by                            appendiceal orifice and ileocecal valve. The                            colonoscopy was performed without difficulty. The                            patient tolerated the procedure well. The quality                            of the bowel preparation was good. The ileocecal                            valve, appendiceal orifice, and rectum were                            photographed. The bowel preparation used was                            SUFLAVE via split dose instruction. Scope In: 10:56:02 AM Scope Out: 11:04:14 AM Scope Withdrawal Time: 0 hours 5 minutes 58 seconds  Total Procedure Duration: 0 hours 8 minutes 12 seconds  Findings:                 The perianal and digital rectal examinations were                            normal. Pertinent negatives include normal prostate                            (size, shape, and consistency).                           The entire examined colon appeared normal on direct                            and retroflexion views. Complications:  No immediate complications. Estimated Blood Loss:     Estimated blood loss: none. Impression:               - The entire examined colon is normal on direct and                            retroflexion views.                           - No specimens collected. Recommendation:           - Patient has a contact number available for                            emergencies. The signs and symptoms of potential                            delayed complications were discussed with the                            patient. Return to normal activities tomorrow.                            Written discharge instructions were provided to the                            patient.                           - Resume previous diet.                           - Continue present medications.                           - Repeat colonoscopy in 5 years for screening                             purposes.                           - Obtain evaluation for elevated blood pressure -                            suspect he has chronic hypertension that is                            untreated - advised patient to go to urgent care                            and obtain a PCP Iva Boop, MD 08/11/2023 11:11:06 AM This report has been signed electronically.

## 2023-08-12 ENCOUNTER — Telehealth: Payer: Self-pay

## 2023-08-12 NOTE — Telephone Encounter (Signed)
  Follow up Call-     08/11/2023    9:57 AM  Call back number  Post procedure Call Back phone  # [phone]  Permission to leave phone message Yes     Patient questions:  Do you have a fever, pain , or abdominal swelling? No. Pain Score  0 *  Have you tolerated food without any problems? Yes.    Have you been able to return to your normal activities? Yes.    Do you have any questions about your discharge instructions: Diet   No. Medications  No. Follow up visit  No.  Do you have questions or concerns about your Care? No.  Actions: * If pain score is 4 or above: No action needed, pain <4.

## 2023-08-18 ENCOUNTER — Encounter: Payer: Managed Care, Other (non HMO) | Admitting: Internal Medicine
